# Patient Record
Sex: Female | Born: 1990 | Hispanic: No | Marital: Single | Smoking: Never smoker
Health system: Southern US, Community
[De-identification: ages and names within clinical notes are randomized; demographics above are authoritative.]

## PROBLEM LIST (undated history)

## (undated) DIAGNOSIS — Z789 Other specified health status: Secondary | ICD-10-CM

## (undated) HISTORY — PX: NO PAST SURGERIES: SHX2092

---

## 2016-11-12 ENCOUNTER — Encounter (HOSPITAL_COMMUNITY): Payer: Self-pay

## 2016-11-12 ENCOUNTER — Inpatient Hospital Stay (HOSPITAL_COMMUNITY)
Admission: AD | Admit: 2016-11-12 | Discharge: 2016-11-12 | Disposition: A | Discharge status: Home / Self Care | Payer: Medicaid Other | Source: Ambulatory Visit | Attending: Obstetrics and Gynecology | Admitting: Obstetrics and Gynecology

## 2016-11-12 DIAGNOSIS — K5900 Constipation, unspecified: Secondary | ICD-10-CM | POA: Diagnosis not present

## 2016-11-12 DIAGNOSIS — R102 Pelvic and perineal pain: Secondary | ICD-10-CM | POA: Diagnosis not present

## 2016-11-12 DIAGNOSIS — N926 Irregular menstruation, unspecified: Secondary | ICD-10-CM | POA: Insufficient documentation

## 2016-11-12 DIAGNOSIS — N939 Abnormal uterine and vaginal bleeding, unspecified: Secondary | ICD-10-CM

## 2016-11-12 DIAGNOSIS — R109 Unspecified abdominal pain: Secondary | ICD-10-CM | POA: Diagnosis present

## 2016-11-12 DIAGNOSIS — K5901 Slow transit constipation: Secondary | ICD-10-CM

## 2016-11-12 HISTORY — DX: Other specified health status: Z78.9

## 2016-11-12 LAB — URINALYSIS, ROUTINE W REFLEX MICROSCOPIC
Bilirubin Urine: NEGATIVE
GLUCOSE, UA: NEGATIVE mg/dL
Hgb urine dipstick: NEGATIVE
Ketones, ur: NEGATIVE mg/dL
Nitrite: NEGATIVE
PROTEIN: NEGATIVE mg/dL
Specific Gravity, Urine: 1.008 (ref 1.005–1.030)
pH: 8 (ref 5.0–8.0)

## 2016-11-12 LAB — WET PREP, GENITAL
CLUE CELLS WET PREP: NONE SEEN
Sperm: NONE SEEN
TRICH WET PREP: NONE SEEN
WBC, Wet Prep HPF POC: NONE SEEN
YEAST WET PREP: NONE SEEN

## 2016-11-12 LAB — POCT PREGNANCY, URINE: Preg Test, Ur: NEGATIVE

## 2016-11-12 MED ORDER — DOCUSATE SODIUM 250 MG PO CAPS: 250.0000 mg | ORAL_CAPSULE | Freq: Every day | ORAL | 0 refills | Status: DC | PRN

## 2016-11-12 MED ORDER — IBUPROFEN 600 MG PO TABS: 600.0000 mg | ORAL_TABLET | Freq: Four times a day (QID) | ORAL | 0 refills | Status: DC | PRN

## 2016-11-12 MED ORDER — POLYETHYLENE GLYCOL 3350 17 GM/SCOOP PO POWD: ORAL | 0 refills | Status: DC

## 2016-11-12 NOTE — Discharge Instructions (Signed)
Constipation, Adult Constipation is when a person:  Poops (has a bowel movement) fewer times in a week than normal.  Has a hard time pooping.  Has poop that is dry, hard, or bigger than normal.  Follow these instructions at home: Eating and drinking   Eat foods that have a lot of fiber, such as: ? Fresh fruits and vegetables. ? Whole grains. ? Beans.  Eat less of foods that are high in fat, low in fiber, or overly processed, such as: ? [place] fries. ? Hamburgers. ? Cookies. ? Candy. ? Soda.  Drink enough fluid to keep your pee (urine) clear or pale yellow. General instructions  Exercise regularly or as told by your doctor.  Go to the restroom when you feel like you need to poop. Do not hold it in.  Take over-the-counter and prescription medicines only as told by your doctor. These include any fiber supplements.  Do pelvic floor retraining exercises, such as: ? Doing deep breathing while relaxing your lower belly (abdomen). ? Relaxing your pelvic floor while pooping.  Watch your condition for any changes.  Keep all follow-up visits as told by your doctor. This is important. Contact a doctor if:  You have pain that gets worse.  You have a fever.  You have not pooped for 4 days.  You throw up (vomit).  You are not hungry.  You lose weight.  You are bleeding from the anus.  You have thin, pencil-like poop (stool). Get help right away if:  You have a fever, and your symptoms suddenly get worse.  You leak poop or have blood in your poop.  Your belly feels hard or bigger than normal (is bloated).  You have very bad belly pain.  You feel dizzy or you faint. This information is not intended to replace advice given to you by your health care provider. Make sure you discuss any questions you have with your health care provider. Document Released: 08/04/2007 Document Revised: 09/05/2015 Document Reviewed: 08/06/2015 Elsevier Interactive Patient Education   2017 Else[employer] Inc[employer] Abnormal Uterine Bleeding Abnormal uterine bleeding means bleeding more than usual from your uterus. It can include:  Bleeding between periods.  Bleeding after sex.  Bleeding that is heavier than normal.  Periods that last longer than usual.  Bleeding after you have stopped having your period (menopause).  There are many problems that may cause this. You should see a doctor for any kind of bleeding that is not normal. Treatment depends on the cause of the bleeding. Follow these instructions at home:  Watch your condition for any changes.  Do not use tampons, douche, or have sex, if your doctor tells you not to.  Change your pads often.  Get regular well-woman exams. Make sure they include a pelvic exam and cervical cancer screening.  Keep all follow-up visits as told by your doctor. This is important. Contact a doctor if:  The bleeding lasts more than one week.  You feel dizzy at times.  You feel like you are going to throw up (nauseous).  You throw up. Get help right away if:  You pass out.  You have to change pads every hour.  You have belly (abdominal) pain.  You have a fever.  You get sweaty.  You get weak.  You passing large blood clots from your vagina. Summary  Abnormal uterine bleeding means bleeding more than usual from your uterus.  There are many problems that may cause this. You should see a doctor for any  kind of bleeding that is not normal.  Treatment depends on the cause of the bleeding. This information is not intended to replace advice given to you by your health care provider. Make sure you discuss any questions you have with your health care provider. Document Released: 12/13/2008 Document Revised: 02/10/2016 Document Reviewed: 02/10/2016 Elsevier Interactive Patient Education  2017 [employer].

## 2016-11-12 NOTE — MAU Provider Note (Signed)
History     CSN: [id number]  Arrival date and time: 11/12/16 2026   First Provider Initiated Contact with Patient 11/12/16 2201      Chief Complaint  Patient presents with  . Abdominal Pain   HPI [name] is a 26 y.o. G0P0000 who presents to MAU today with complaint of lower abdominal pain x 3 years. The patient also states that she has always had irregular periods. She will often skip a month of her period and is unsure why. She states last month she only had spotting and is unsure when her last normal period was. She states pain is rated at 7/10. She takes Tylenol with some relief, last dose was yesterday. She denies discharge. She has never been sexually active. She states menarche around age 75 years.    OB History    Gravida Para Term Preterm AB Living   0 0 0 0 0 0   SAB TAB Ectopic Multiple Live Births   0 0 0 0 0      Past Medical History:  Diagnosis Date  . Medical history non-contributory     Past Surgical History:  Procedure Laterality Date  . NO PAST SURGERIES      Family History  Problem Relation Age of Onset  . Heart disease Mother     Social History  Substance Use Topics  . Smoking status: Never Smoker  . Smokeless tobacco: Never Used  . Alcohol use No    Allergies: No Known Allergies  No prescriptions prior to admission.    Review of Systems  Constitutional: Negative for fever.  Gastrointestinal: Positive for abdominal pain. Negative for constipation, diarrhea, nausea and vomiting.  Genitourinary: Negative for dysuria, frequency, urgency, vaginal bleeding and vaginal discharge.   Physical Exam   Blood pressure 110/71, pulse 69, temperature 98.5 F (36.9 C), temperature source Oral, resp. rate 16, height 5\' 4"  (1.626 m), weight 123 lb (55.8 kg).  Physical Exam  Nursing note and vitals reviewed. Constitutional: She is oriented to person, place, and time. She appears well-developed and well-nourished. No distress.  HENT:  Head:  Normocephalic and atraumatic.  Cardiovascular: Normal rate.   Respiratory: Effort normal.  GI: Soft. She exhibits no distension and no mass. There is tenderness (mild tenderness to palpation of the suprapubic region ). There is no rebound and no guarding.  Genitourinary: Uterus is not enlarged and not tender. Cervix exhibits no motion tenderness, no discharge and no friability. Right adnexum displays no mass and no tenderness. Left adnexum displays no mass and no tenderness. No bleeding in the vagina. Vaginal discharge (scant white) found.  Neurological: She is alert and oriented to person, place, and time.  Skin: Skin is warm and dry. No erythema.  Psychiatric: She has a normal mood and affect.    Results for orders placed or performed during the hospital encounter of 11/12/16 (from the past 24 hour(s))  Urinalysis, Routine w reflex microscopic     Status: Abnormal   Collection Time: 11/12/16  8:50 PM  Result Value Ref Range   Color, Urine YELLOW YELLOW   APPearance CLEAR CLEAR   Specific Gravity, Urine 1.008 1.005 - 1.030   pH 8.0 5.0 - 8.0   Glucose, UA NEGATIVE NEGATIVE mg/dL   Hgb urine dipstick NEGATIVE NEGATIVE   Bilirubin Urine NEGATIVE NEGATIVE   Ketones, ur NEGATIVE NEGATIVE mg/dL   Protein, ur NEGATIVE NEGATIVE mg/dL   Nitrite NEGATIVE NEGATIVE   Leukocytes, UA LARGE (A) NEGATIVE   RBC /  HPF 0-5 0 - 5 RBC/hpf   WBC, UA 6-30 0 - 5 WBC/hpf   Bacteria, UA RARE (A) NONE SEEN   Squamous Epithelial / LPF 0-5 (A) NONE SEEN  Pregnancy, urine POC     Status: None   Collection Time: 11/12/16  9:13 PM  Result Value Ref Range   Preg Test, Ur NEGATIVE NEGATIVE  Wet prep, genital     Status: None   Collection Time: 11/12/16 10:20 PM  Result Value Ref Range   Yeast Wet Prep HPF POC NONE SEEN NONE SEEN   Trich, Wet Prep NONE SEEN NONE SEEN   Clue Cells Wet Prep HPF POC NONE SEEN NONE SEEN   WBC, Wet Prep HPF POC NONE SEEN NONE SEEN   Sperm NONE SEEN     MAU Course   Procedures None  MDM UPT - negative UA, wet prep, GC/Chlamydia, HIV and RPR today   Assessment and Plan  A: Irregular periods  Pelvic pain, chronic Constipation   P: Discharge home Rx for Colace, Miralax and Ibuprofen given to patient  Outpatient GYN [place] ordered. They will call her with an appointment time Patient advised to follow-up with [facility] for further work-up of irregular periods Patient may benefit from GI referral if GYN work-up does not explain chronic pelvic pain Patient may return to MAU as needed or if her condition were to change or worsen  Kerry Hough, PA-C 11/13/2016, 1:53 AM

## 2016-11-12 NOTE — MAU Note (Signed)
Irregular periods and does not know LMP. Some months has alittle bleeding and some months none. Having some lower abd pain. Pain sharp and cramping. No vag bleeding or d/c

## 2016-11-13 LAB — HIV ANTIBODY (ROUTINE TESTING W REFLEX): HIV Screen 4th Generation wRfx: NONREACTIVE

## 2016-11-14 LAB — RPR: RPR: NONREACTIVE

## 2016-11-15 LAB — GC/CHLAMYDIA PROBE AMP (~~LOC~~) NOT AT ARMC
Chlamydia: NEGATIVE
Neisseria Gonorrhea: NEGATIVE

## 2016-11-22 ENCOUNTER — Encounter: Payer: Self-pay | Admitting: Obstetrics & Gynecology

## 2016-12-06 ENCOUNTER — Encounter: Payer: Self-pay | Admitting: Obstetrics & Gynecology

## 2016-12-06 ENCOUNTER — Ambulatory Visit (INDEPENDENT_AMBULATORY_CARE_PROVIDER_SITE_OTHER): Payer: Medicaid Other | Admitting: Obstetrics & Gynecology

## 2016-12-06 VITALS — BP 112/74 | HR 73 | Wt 121.0 lb

## 2016-12-06 DIAGNOSIS — N926 Irregular menstruation, unspecified: Secondary | ICD-10-CM

## 2016-12-06 DIAGNOSIS — R102 Pelvic and perineal pain: Secondary | ICD-10-CM | POA: Diagnosis present

## 2016-12-06 MED ORDER — MEDROXYPROGESTERONE ACETATE 10 MG PO TABS: 10.0000 mg | ORAL_TABLET | Freq: Every day | ORAL | 2 refills | Status: DC

## 2016-12-06 NOTE — Progress Notes (Signed)
Patient ID: Judy Jackson, female   DOB: August 19, 1990, 26 y.o.   MRN: [id number]  Chief Complaint  Patient presents with  . Menstrual Problem    irregular periods  . Pelvic Pain    HPI Judy Jackson is a 26 y.o. female.  She is here because her periods are irregular.  She started her period at age 49. They were regular and monthly until about 6 months ago. Since then she has been having less than normal periods. She hasn't had a period in about 3 months.  She also has the complaint of pelvic pain. She went to the ER and was told to take Miralax and stool softeners. Her constipation has resolved, but this did not help her pelvic pain. HPI  Past Medical History:  Diagnosis Date  . Medical history non-contributory     Past Surgical History:  Procedure Laterality Date  . NO PAST SURGERIES      Family History  Problem Relation Age of Onset  . Heart disease Mother     Social History Social History  Substance Use Topics  . Smoking status: Never Smoker  . Smokeless tobacco: Never Used  . Alcohol use No    No Known Allergies  Current Outpatient Prescriptions  Medication Sig Dispense Refill  . docusate sodium (COLACE) 250 MG capsule Take 1 capsule (250 mg total) by mouth daily as needed for constipation. (Patient not taking: Reported on 12/06/2016) 30 capsule 0  . polyethylene glycol powder (MIRALAX) powder 1 capful daily until normal BM (Patient not taking: Reported on 12/06/2016) 255 g 0   No current facility-administered medications for this visit.     Review of Systems Review of Systems  She is single, virginal She reports a cervical biopsy by her gynecologist, reports that she was told that she was ok. She is unable to tell me who the gynecologist is.  Blood pressure 112/74, pulse 73, weight 121 lb (54.9 kg), last menstrual period 09/15/2016.  Physical Exam Physical Exam  Data Reviewed  Results for CHIKITA, DOGAN (MRN [id number]) as of 12/06/2016 09:37  Ref. Range  11/12/2016 00:00  Chlamydia Unknown Negative  Neisseria gonorrhea Unknown Negative   Assessment    Pelvic pain  Missed periods    Plan    Schedule gyn u/s Check TSH Provera challenge Come back in a month       Charly Holcomb C Oneta Sigman 12/06/2016, 9:14 AM

## 2016-12-07 LAB — TSH: TSH: 2.78 u[IU]/mL (ref 0.450–4.500)

## 2016-12-13 ENCOUNTER — Ambulatory Visit (HOSPITAL_COMMUNITY)
Admission: RE | Admit: 2016-12-13 | Discharge: 2016-12-13 | Disposition: A | Discharge status: Home / Self Care | Payer: Medicaid Other | Source: Ambulatory Visit | Attending: Obstetrics & Gynecology | Admitting: Obstetrics & Gynecology

## 2016-12-13 DIAGNOSIS — D252 Subserosal leiomyoma of uterus: Secondary | ICD-10-CM | POA: Diagnosis not present

## 2016-12-13 DIAGNOSIS — R102 Pelvic and perineal pain: Secondary | ICD-10-CM | POA: Insufficient documentation

## 2016-12-13 DIAGNOSIS — N926 Irregular menstruation, unspecified: Secondary | ICD-10-CM | POA: Insufficient documentation

## 2017-01-04 ENCOUNTER — Telehealth: Payer: Self-pay | Admitting: General Practice

## 2017-01-04 NOTE — Telephone Encounter (Signed)
Patient called and left message requesting a call back. Called patient, no answer- left message stating we are trying to reach you to return your phone call, please call us back

## 2017-01-05 NOTE — Telephone Encounter (Signed)
Patient called and left message requesting call back. Called patient, no answer- left message stating we are trying to reach you to return your phone call. Please call Judy Jackson back and leave a detailed message in how we can assist you. You may also let Judy Jackson know if we can leave a detailed message on your voicemail

## 2017-03-29 ENCOUNTER — Inpatient Hospital Stay (HOSPITAL_COMMUNITY)
Admission: AD | Admit: 2017-03-29 | Discharge: 2017-03-29 | Disposition: A | Discharge status: Home / Self Care | Payer: Medicaid Other | Source: Ambulatory Visit | Attending: Obstetrics and Gynecology | Admitting: Obstetrics and Gynecology

## 2017-03-29 ENCOUNTER — Encounter (HOSPITAL_COMMUNITY): Payer: Self-pay | Admitting: *Deleted

## 2017-03-29 DIAGNOSIS — N911 Secondary amenorrhea: Secondary | ICD-10-CM

## 2017-03-29 MED ORDER — POLYETHYLENE GLYCOL 3350 17 GM/SCOOP PO POWD: 17.0000 g | Freq: Every day | ORAL | 1 refills | Status: AC | PRN

## 2017-03-29 NOTE — Discharge Instructions (Signed)
Secondary Amenorrhea Secondary amenorrhea is the stopping of menstrual flow for 3-6 months in a female who has previously had periods. There are many possible causes. Most of these causes are not serious. Usually, treating the underlying problem causing the loss of menses will return your periods to normal. What are the causes? Some common and uncommon causes of not menstruating include:  Malnutrition.  Low blood sugar (hypoglycemia).  Polycystic ovary disease.  Stress or fear.  Breastfeeding.  Hormone imbalance.  Ovarian failure.  Medicines.  Extreme obesity.  Cystic fibrosis.  Low body weight or drastic weight reduction from any cause.  Early menopause.  Removal of ovaries or uterus.  Contraceptives.  Illness.  Long-term (chronic) illnesses.  Cushing syndrome.  Thyroid problems.  Birth control pills, patches, or vaginal rings for birth control.  What increases the risk? You may be at greater risk of secondary amenorrhea if:  You have a family history of this condition.  You have an eating disorder.  You do athletic training.  How is this diagnosed? A diagnosis is made by your health care provider taking a medical history and doing a physical exam. This will include a pelvic exam to check for problems with your reproductive organs. Pregnancy must be ruled out. Often, numerous blood tests are done to measure different hormones in the body. Urine testing may be done. Specialized exams (ultrasound, CT scan, MRI, or hysteroscopy) may have to be done as well as measuring the body mass index (BMI). How is this treated? Treatment depends on the cause of the amenorrhea. If an eating disorder is present, this can be treated with an adequate diet and therapy. Chronic illnesses may improve with treatment of the illness. Amenorrhea may be corrected with medicines, lifestyle changes, or surgery. If the amenorrhea cannot be corrected, it is sometimes possible to create a  false menstruation with medicines. Follow these instructions at home:  Maintain a healthy diet.  Manage weight problems.  Exercise regularly but not excessively.  Get adequate sleep.  Manage stress.  Be aware of changes in your menstrual cycle. Keep a record of when your periods occur. Note the date your period starts, how long it lasts, and any problems. Contact a health care provider if: Your symptoms do not get better with treatment. This information is not intended to replace advice given to you by your health care provider. Make sure you discuss any questions you have with your health care provider. Document Released: 03/29/2006 Document Revised: 07/24/2015 Document Reviewed: 08/03/2012 Elsevier Interactive Patient Education  2018 [employer].   Uterine Fibroids Uterine fibroids are tissue masses (tumors). They are also called leiomyomas. They can develop inside of a womans womb (uterus). They can grow very large. Fibroids are not cancerous (benign). Most fibroids do not require medical treatment. Follow these instructions at home:  Keep all follow-up visits as told by your doctor. This is important.  Take medicines only as told by your doctor. ? If you were prescribed a hormone treatment, take the hormone medicines exactly as told. ? Do not take aspirin. It can cause bleeding.  Ask your doctor about taking iron pills and increasing the amount of dark green, leafy vegetables in your diet. These actions can help to boost your blood iron levels.  Pay close attention to your period. Tell your doctor about any changes, such as: ? Increased blood flow. This may require you to use more pads or tampons than usual per month. ? A change in the number of days  that your period lasts per month. ? A change in symptoms that come with your period, such as back pain or cramping in your belly area (abdomen). Contact a doctor if:  You have pain in your back or the area between your hip  bones (pelvic area) that is not controlled by medicines.  You have pain in your abdomen that is not controlled with medicines.  You have an increase in bleeding between and during periods.  You soak tampons or pads in a half hour or less.  You feel lightheaded.  You feel extra tired.  You feel weak. Get help right away if:  You pass out (faint).  You have a sudden increase in pelvic pain. This information is not intended to replace advice given to you by your health care provider. Make sure you discuss any questions you have with your health care provider. Document Released: 03/20/2010 Document Revised: 10/17/2015 Document Reviewed: 08/14/2013 Elsevier Interactive Patient Education  [employer].

## 2017-03-29 NOTE — MAU Note (Signed)
Marlou Porch, CNM in triage with patient. Discussing plan of care.

## 2017-03-29 NOTE — MAU Provider Note (Signed)
Chief Complaint: No chief complaint on file.   First Provider Initiated Contact with Patient 03/29/17 1248     SUBJECTIVE HPI: Judy Jackson is a 27 y.o. G0P0000 female who presents to Maternity Admissions reporting not getting her period. Denies pain. Has been evaluated for this at [facility] in October 2018. Has not followed-up for results or further work-up. Had Nml TSH and essentially Nml pelvic US except 1.7 cm fibroid.   Associated signs and symptoms: Neg for abd pain, vaginal discharge.   Past Medical History:  Diagnosis Date  . Medical history non-contributory    OB History  Gravida Para Term Preterm AB Living  0 0 0 0 0 0  SAB TAB Ectopic Multiple Live Births  0 0 0 0 0       Past Surgical History:  Procedure Laterality Date  . NO PAST SURGERIES     Social History   Socioeconomic History  . Marital status: Single    Spouse name: Not on file  . Number of children: Not on file  . Years of education: Not on file  . Highest education level: Not on file  Social Needs  . Financial resource strain: Not on file  . Food insecurity - worry: Not on file  . Food insecurity - inability: Not on file  . Transportation needs - medical: Not on file  . Transportation needs - non-medical: Not on file  Occupational History  . Not on file  Tobacco Use  . Smoking status: Never Smoker  . Smokeless tobacco: Never Used  Substance and Sexual Activity  . Alcohol use: No  . Drug use: No  . Sexual activity: Not on file  Other Topics Concern  . Not on file  Social History Narrative  . Not on file   Family History  Problem Relation Age of Onset  . Heart disease Mother    No current facility-administered medications on file prior to encounter.    Current Outpatient Medications on File Prior to Encounter  Medication Sig Dispense Refill  . docusate sodium (COLACE) 250 MG capsule Take 1 capsule (250 mg total) by mouth daily as needed for constipation. (Patient not taking: Reported on  12/06/2016) 30 capsule 0  . medroxyPROGESTERone (PROVERA) 10 MG tablet Take 1 tablet (10 mg total) by mouth daily. Use for ten days 10 tablet 2   No Known Allergies  I have reviewed patient's Past Medical Hx, Surgical Hx, Family Hx, Social Hx, medications and allergies.   Review of Systems  Gastrointestinal: Negative for abdominal pain.  Genitourinary: Negative for vaginal bleeding and vaginal discharge.    OBJECTIVE Patient Vitals for the past 24 hrs:  BP Temp Temp src Pulse Resp SpO2 Weight  03/29/17 1218 (!) 101/58 98.3 F (36.8 C) Oral 68 16 99 % 129 lb (58.5 kg)   Constitutional: Well-developed, well-nourished female in no acute distress.  Cardiovascular: normal rate Respiratory: normal rate and effort.  GI: Deferred Neurologic: Alert and oriented x 4.  GU: Deffered  LAB RESULTS No results found for this or any previous visit (from the past 24 hour(s)).  IMAGING No results found.  MAU COURSE Orders Placed This Encounter  Procedures  . Discharge patient   Meds ordered this encounter  Medications  . polyethylene glycol powder (MIRALAX) powder    Sig: Take 17 g by mouth daily as needed.    Dispense:  255 g    Refill:  1    Order Specific Question:   Supervising Provider  Answer:   Truett Mainland [[id number]]    MDM - Secondary amenorrhea in pt w/ irreg cycles and no pain or bleeding. No evidence of emergent condition. Will schedule w/ out Gyn appt tomorrow for further eval.   ASSESSMENT 1. Secondary amenorrhea     PLAN Discharge home in stable condition.  Follow-up [place] for [place] Follow up on 03/30/2017.   Specialty:  Obstetrics and Gynecology Why:  at 2:35 pm for appointment Contact information: [place] [phone]         Allergies as of 03/29/2017   No Known Allergies     Medication List    STOP taking these medications   docusate sodium 250 MG capsule Commonly  known as:  COLACE   medroxyPROGESTERone 10 MG tablet Commonly known as:  PROVERA     TAKE these medications   polyethylene glycol powder powder Commonly known as:  MIRALAX Take 17 g by mouth daily as needed. What changed:    how much to take  how to take this  when to take this  reasons to take this  additional instructions        Tamala Julian [place], [place] 03/29/2017  1:00 PM

## 2017-03-29 NOTE — MAU Note (Signed)
Patient here with c/o Irregular period.  Patient is concerned that she has not had a period since being seen in October.  Denies any pain at this time.

## 2017-03-30 ENCOUNTER — Ambulatory Visit: Payer: Medicaid Other | Admitting: Obstetrics & Gynecology

## 2017-03-30 DIAGNOSIS — N926 Irregular menstruation, unspecified: Secondary | ICD-10-CM

## 2017-03-30 NOTE — Progress Notes (Signed)
Patient arrived but the left prior to evaluation. Was called for multiple times in lobby.  Verita Schneiders, MD, [place] for [employer], [place]

## 2017-05-02 ENCOUNTER — Other Ambulatory Visit: Payer: Self-pay | Admitting: Medical

## 2017-12-22 IMAGING — US US PELVIS COMPLETE
1 series · 15 of 25 positions shown · non-contrast
Comparison: None

CLINICAL DATA: Pelvic pain with irregular cycles



[Series 1: us pelvis complete · 15 of 50 slices shown]
[im 1/50]
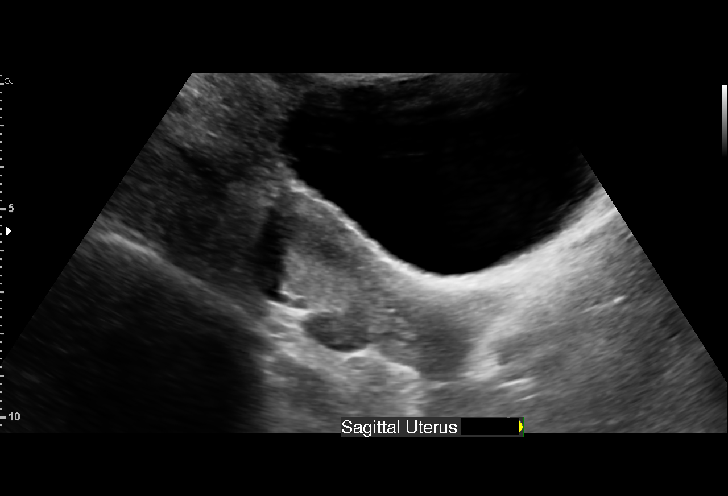
[im 5/50]
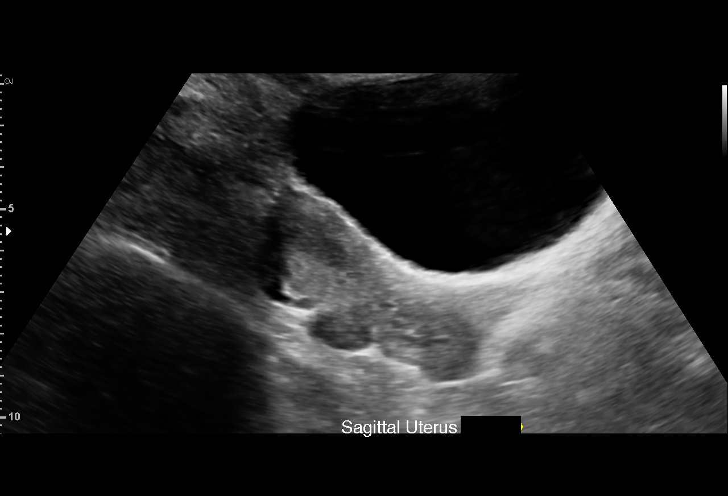
[im 9/50]
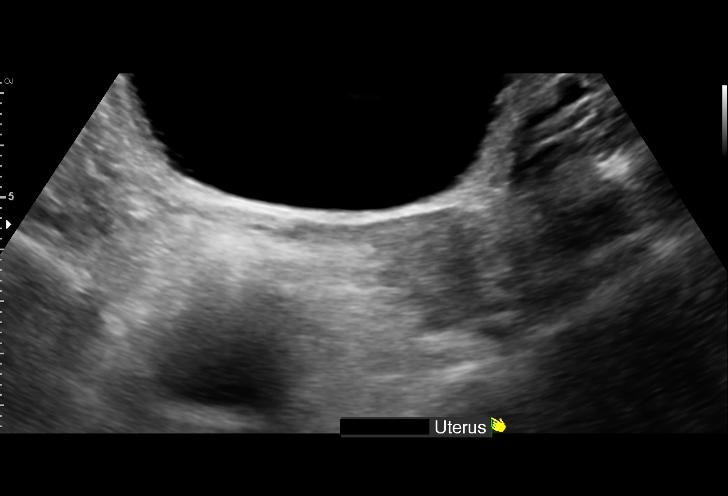
[im 11/50]
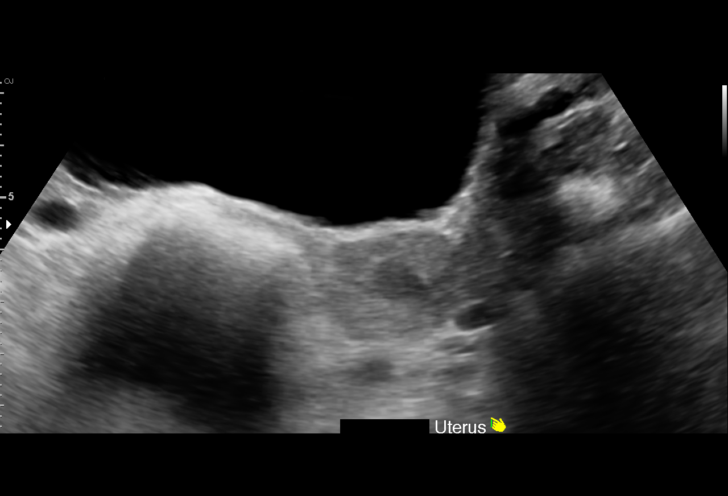
[im 15/50]
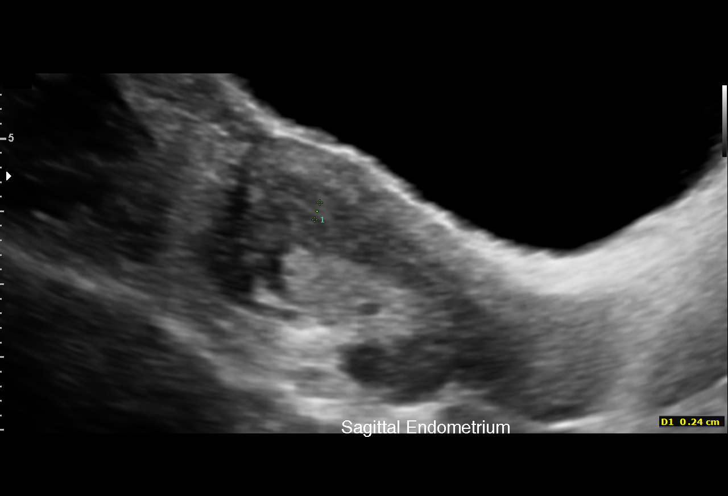
[im 19/50]
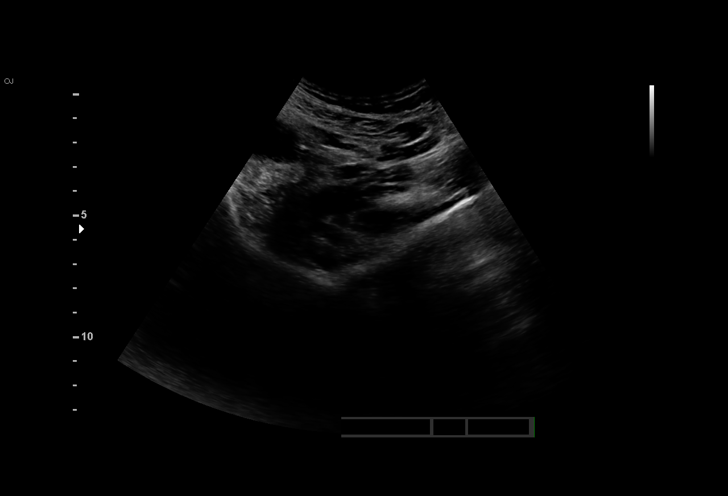
[im 21/50]
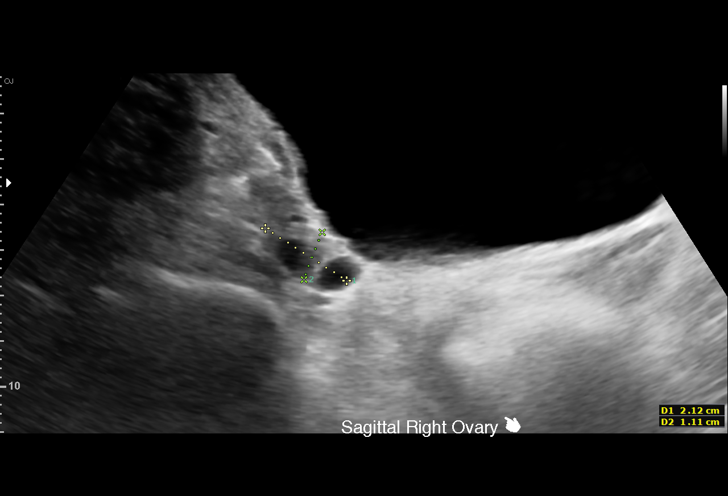
[im 25/50]
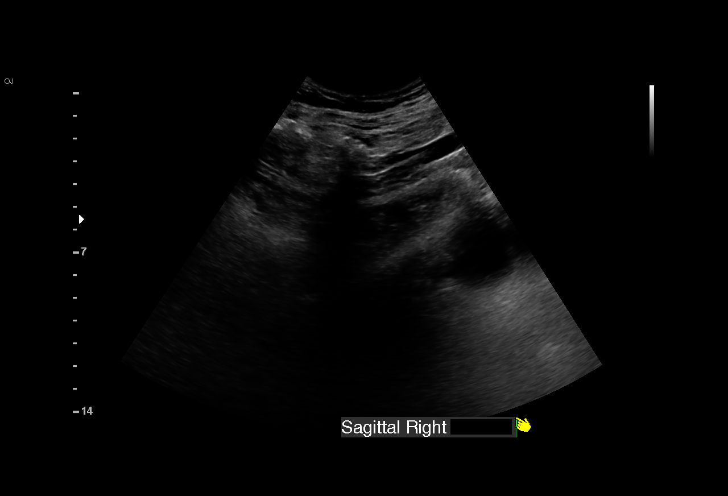
[im 29/50]
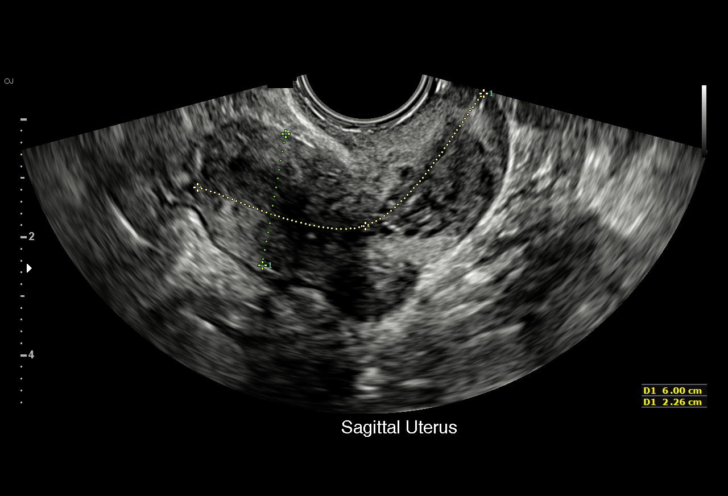
[im 31/50]
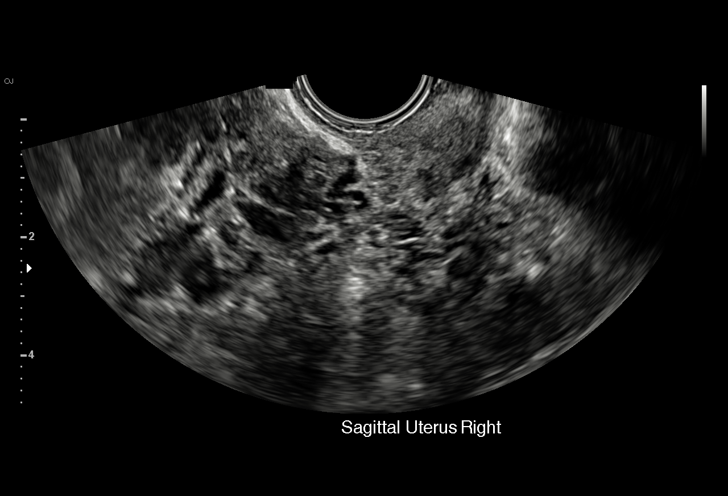
[im 35/50]
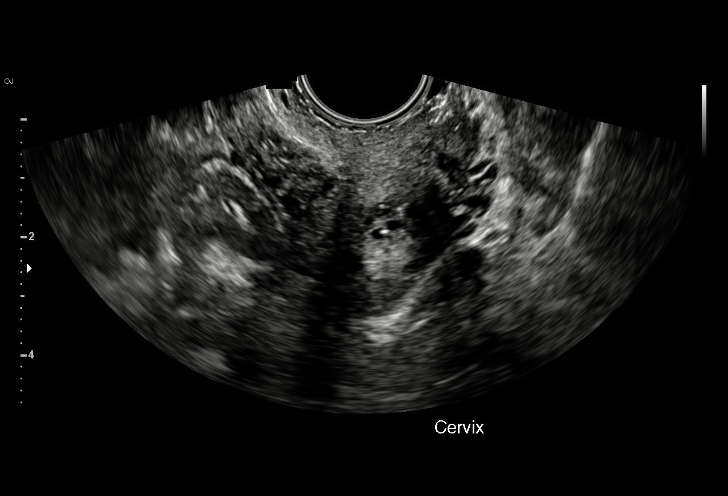
[im 39/50]
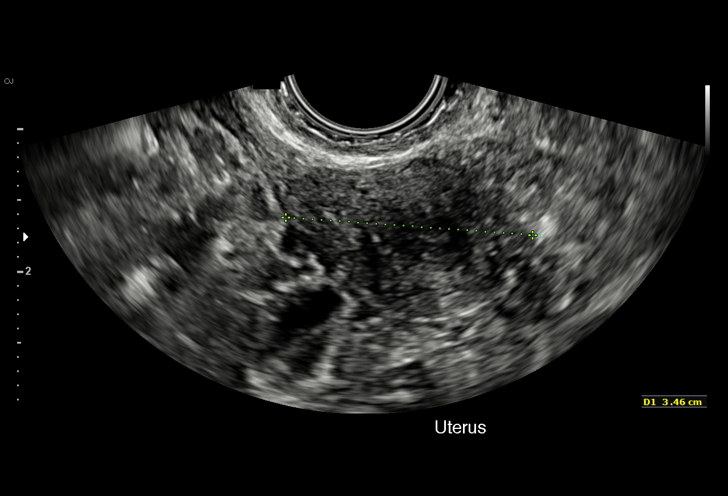
[im 41/50]
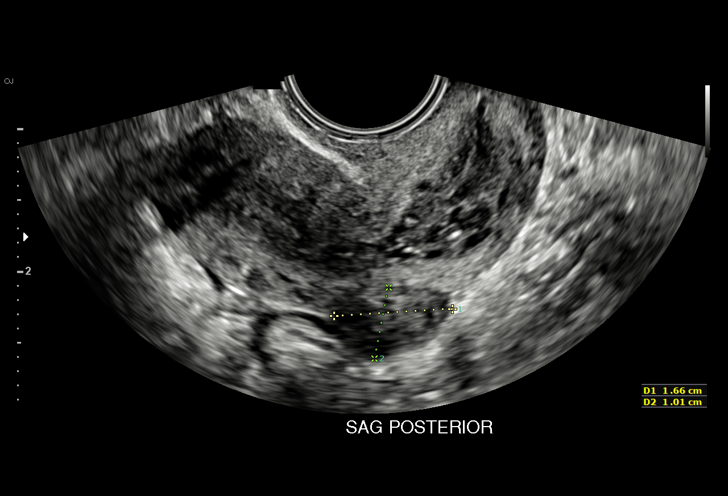
[im 45/50]
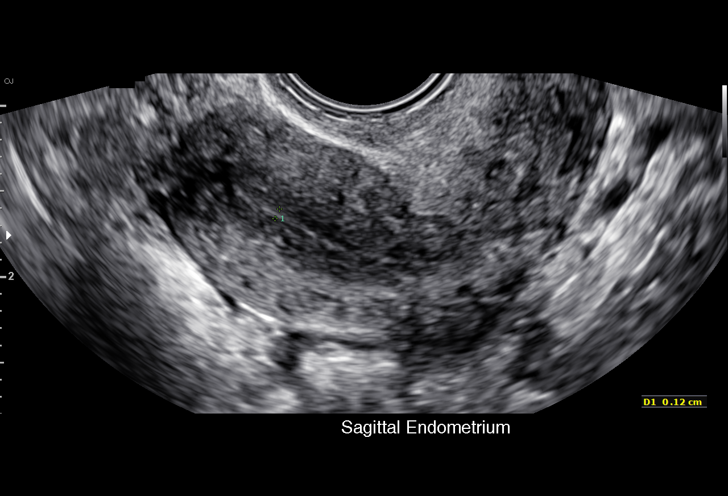
[im 50/50]
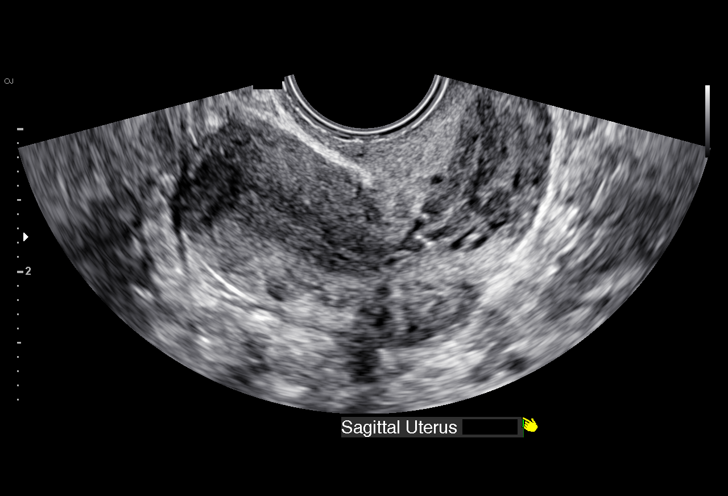

[15 of 25 positions shown; findings below may reference images not displayed]

FINDINGS: Uterus

Measurements: 6.0 x 2.3 x 3.5 cm. Posterior body subserosal fibroid
measures up to 1.7 cm.

Endometrium

Thickness: Normal thickness, 2 mm.  No focal abnormality visualized.

Right ovary

Measurements: 2.1 x 1.1 x 1.9 cm. Normal appearance/no adnexal mass.

Left ovary

Measurements: Not visualized. No adnexal mass seen.

Other findings

No abnormal free fluid.
IMPRESSION: 1.7 cm posterior left subserosal fibroid.

No acute findings.
